# Patient Record
Sex: Female | Born: 1965 | Race: Black or African American | Hispanic: No | Smoking: Never smoker
Health system: Southern US, Community
[De-identification: ages and names within clinical notes are randomized; demographics above are authoritative.]

---

## 2021-05-31 ENCOUNTER — Emergency Department: Payer: BLUE CROSS/BLUE SHIELD

## 2021-05-31 ENCOUNTER — Other Ambulatory Visit: Payer: Self-pay

## 2021-05-31 ENCOUNTER — Emergency Department
Admission: EM | Admit: 2021-05-31 | Discharge: 2021-05-31 | Disposition: A | Discharge status: Home / Self Care | Payer: BLUE CROSS/BLUE SHIELD | Attending: Emergency Medicine | Admitting: Emergency Medicine

## 2021-05-31 ENCOUNTER — Encounter: Payer: Self-pay | Admitting: *Deleted

## 2021-05-31 DIAGNOSIS — R519 Headache, unspecified: Secondary | ICD-10-CM | POA: Diagnosis present

## 2021-05-31 DIAGNOSIS — I10 Essential (primary) hypertension: Secondary | ICD-10-CM | POA: Insufficient documentation

## 2021-05-31 DIAGNOSIS — F439 Reaction to severe stress, unspecified: Secondary | ICD-10-CM | POA: Insufficient documentation

## 2021-05-31 DIAGNOSIS — E039 Hypothyroidism, unspecified: Secondary | ICD-10-CM | POA: Insufficient documentation

## 2021-05-31 LAB — BASIC METABOLIC PANEL
Anion gap: 8 (ref 5–15)
BUN: 13 mg/dL (ref 6–20)
CO2: 25 mmol/L (ref 22–32)
Calcium: 9.2 mg/dL (ref 8.9–10.3)
Chloride: 103 mmol/L (ref 98–111)
Creatinine, Ser: 0.93 mg/dL (ref 0.44–1.00)
GFR, Estimated: >60 mL/min (ref >60)
Glucose, Bld: 102 mg/dL — ABNORMAL HIGH (ref 70–99)
Potassium: 3.8 mmol/L (ref 3.5–5.1)
Sodium: 136 mmol/L (ref 135–145)

## 2021-05-31 LAB — CBC
HCT: 37.4 % (ref 36.0–46.0)
Hemoglobin: 12 g/dL (ref 12.0–15.0)
MCH: 28.2 pg (ref 26.0–34.0)
MCHC: 32.1 g/dL (ref 30.0–36.0)
MCV: 88 fL (ref 80.0–100.0)
Platelets: 244 10*3/uL (ref 150–400)
RBC: 4.25 MIL/uL (ref 3.87–5.11)
RDW: 14.9 % (ref 11.5–15.5)
WBC: 6.7 10*3/uL (ref 4.0–10.5)
nRBC: 0 % (ref 0.0–0.2)

## 2021-05-31 LAB — TSH: TSH: 5.653 u[IU]/mL — ABNORMAL HIGH (ref 0.350–4.500)

## 2021-05-31 LAB — TROPONIN I (HIGH SENSITIVITY): Troponin I (High Sensitivity): <2 ng/L (ref <18)

## 2021-05-31 MED ORDER — LOSARTAN POTASSIUM 50 MG PO TABS: 50.0000 mg | ORAL_TABLET | Freq: Two times a day (BID) | ORAL | 2 refills | Status: AC

## 2021-05-31 MED ORDER — HYDROXYZINE PAMOATE 25 MG PO CAPS: 25.0000 mg | ORAL_CAPSULE | Freq: Every evening | ORAL | 1 refills | Status: AC | PRN

## 2021-05-31 MED ORDER — LEVOTHYROXINE SODIUM 25 MCG PO TABS: 25.0000 ug | ORAL_TABLET | Freq: Every day | ORAL | 2 refills | Status: AC

## 2021-05-31 MED ORDER — CYCLOBENZAPRINE HCL 10 MG PO TABS
10.0000 mg | ORAL_TABLET | Freq: Once | ORAL | Status: AC
  Administered 2021-05-31: 10 mg via ORAL
  Filled 2021-05-31: qty 1

## 2021-05-31 MED ORDER — METOCLOPRAMIDE HCL 10 MG PO TABS
10.0000 mg | ORAL_TABLET | Freq: Once | ORAL | Status: AC
  Administered 2021-05-31: 10 mg via ORAL
  Filled 2021-05-31: qty 1

## 2021-05-31 MED ORDER — HYDROCHLOROTHIAZIDE 25 MG PO TABS: 25.0000 mg | ORAL_TABLET | Freq: Every day | ORAL | 2 refills | Status: AC

## 2021-05-31 MED ORDER — DIPHENHYDRAMINE HCL 25 MG PO CAPS
25.0000 mg | ORAL_CAPSULE | Freq: Once | ORAL | Status: AC
  Administered 2021-05-31: 25 mg via ORAL
  Filled 2021-05-31: qty 1

## 2021-05-31 MED ORDER — CYCLOBENZAPRINE HCL 5 MG PO TABS: 5.0000 mg | ORAL_TABLET | Freq: Every day | ORAL | 0 refills | Status: AC

## 2021-05-31 MED ORDER — METOCLOPRAMIDE HCL 10 MG PO TABS: 10.0000 mg | ORAL_TABLET | Freq: Three times a day (TID) | ORAL | 0 refills | Status: AC | PRN

## 2021-05-31 NOTE — ED Triage Notes (Signed)
Pt states blood pressure elevated for 1 week.  Pt taking bp meds as prescribed.  Pt also has a headache.  Pt alert  speech clear.  Pt ambulates without diff.  No chest pain or sob.

## 2021-05-31 NOTE — Discharge Instructions (Addendum)
Take the prescription meds as directed. Follow-up with your planned primary provider for further management.

## 2021-05-31 NOTE — ED Provider Notes (Signed)
[facility] Emergency Department Provider Note     Event Date/Time   First MD Initiated Contact with Patient 05/31/21 2010     (approximate)   History   Hypertension   HPI  Deborah Villegas is a 56 y.o. female with history of hypertension, presents to the ED for 1 week of elevated blood pressure readings.  Patient admits to taking her medication as prescribed.  She denies any recent injury, illness, trauma.  She does endorse an associated headache.  She denies any slurred speech, weakness, vision change, nausea, vomiting, or syncope.   Physical Exam   Triage Vital Signs: ED Triage Vitals  Enc Vitals Group     BP 05/31/21 1949 (!) 196/107     Pulse Rate 05/31/21 1949 70     Resp 05/31/21 1949 18     Temp 05/31/21 1949 98.1 F (36.7 C)     Temp Source 05/31/21 1949 Oral     SpO2 05/31/21 1949 96 %     Weight 05/31/21 1950 210 lb (95.3 kg)     Height 05/31/21 1950 5\' 4"  (1.626 m)     Head Circumference --      Peak Flow --      Pain Score 05/31/21 1950 6     Pain Loc --      Pain Edu? --      Excl. in GC? --     Most recent vital signs: Vitals:   05/31/21 1949  BP: (!) 196/107  Pulse: 70  Resp: 18  Temp: 98.1 F (36.7 C)  SpO2: 96%    General Awake, no distress. *** {**HEENT NCAT. PERRL. EOMI. No rhinorrhea. Mucous membranes are moist. **} CV:  Good peripheral perfusion. *** RESP:  Normal effort. *** ABD:  No distention. *** {**Other: **}   ED Results / Procedures / Treatments   Labs (all labs ordered are listed, but only abnormal results are displayed) Labs Reviewed  BASIC METABOLIC PANEL - Abnormal; Notable for the following components:      Result Value   Glucose, Bld 102 (*)    All other components within normal limits  CBC  TROPONIN I (HIGH SENSITIVITY)     EKG  ***  RADIOLOGY  {**I personally viewed and evaluated these images as part of my medical decision making, as well as reviewing the written report by the  radiologist.  ED Provider Interpretation: ***}  No results found.   PROCEDURES:  Critical Care performed: {CriticalCareYesNo:19197::"Yes, see critical care procedure note(s)","No"}  Procedures   MEDICATIONS ORDERED IN ED: Medications - No data to display   IMPRESSION / MDM / ASSESSMENT AND PLAN / ED COURSE  I reviewed the triage vital signs and the nursing notes.                              Differential diagnosis includes, but is not limited to, ***  {**The patient is on the cardiac monitor to evaluate for evidence of arrhythmia and/or significant heart rate changes.**}  Patient's diagnosis is consistent with ***. Patient will be discharged home with prescriptions for ***. Patient is to follow up with *** as needed or otherwise directed. Patient is given ED precautions to return to the ED for any worsening or new symptoms.       FINAL CLINICAL IMPRESSION(S) / ED DIAGNOSES   Final diagnoses:  None     Rx / DC Orders   ED  Discharge Orders     None        Note:  This document was prepared using Dragon voice recognition software and may include unintentional dictation errors.

## 2022-10-29 IMAGING — CT CT HEAD W/O CM
4 series · 17 of 47 positions shown, 19 images · non-contrast
Comparison: None Available.

CLINICAL DATA: Headache with hypertension.



[Series 2: head wo · axial · 0.45mm/px · z∈[-83,+47]mm · 7 of 36 slices shown, 9 images]
[im 5/36  brain]
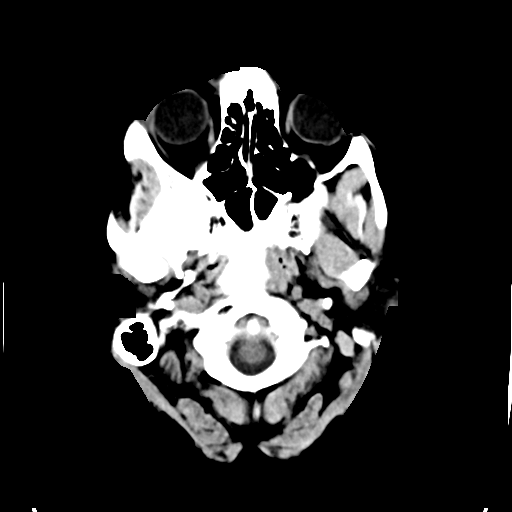
[im 5/36  bone]
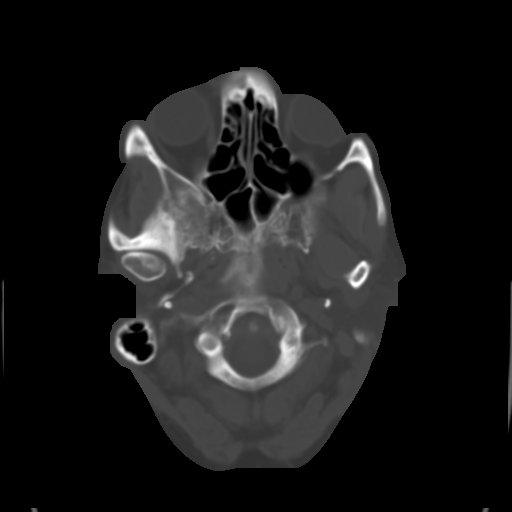
[im 9/36  brain]
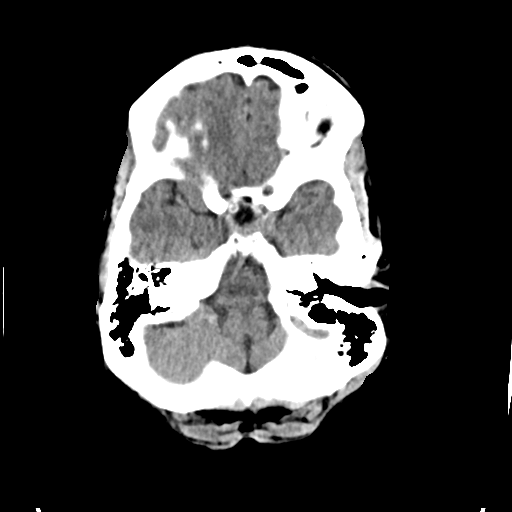
[im 14/36  brain]
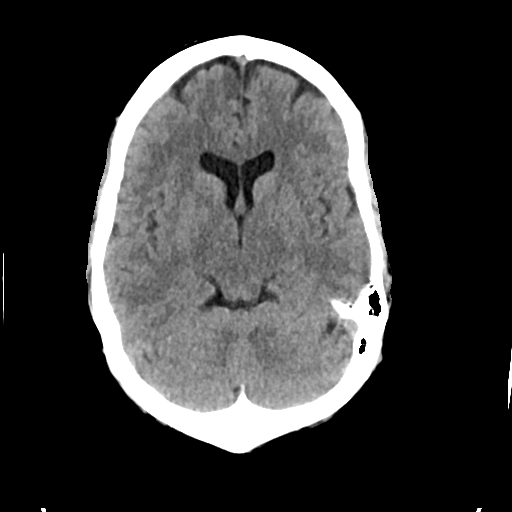
[im 18/36  brain]
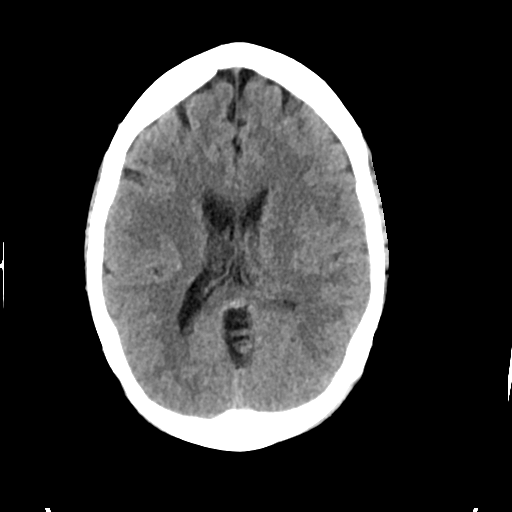
[im 22/36  brain]
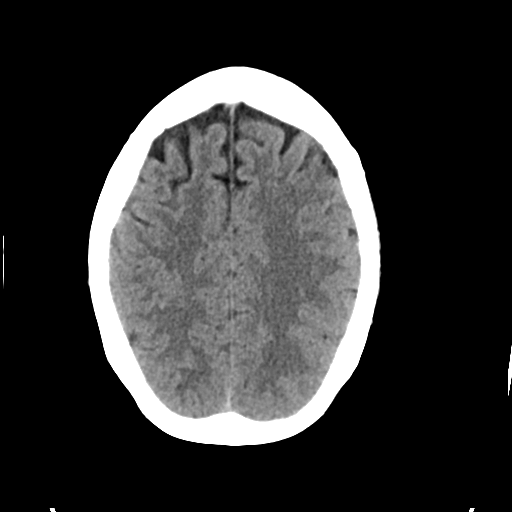
[im 22/36  bone]
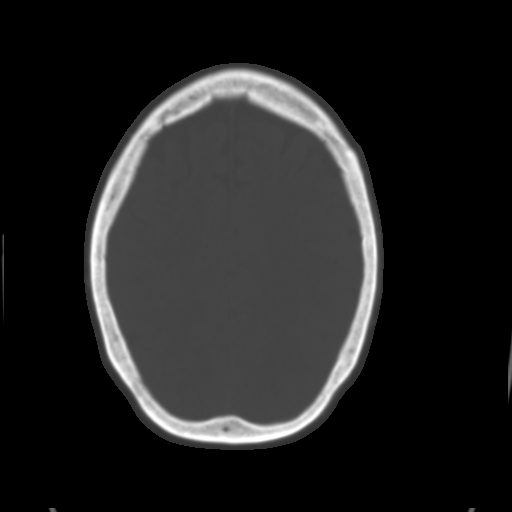
[im 27/36  brain]
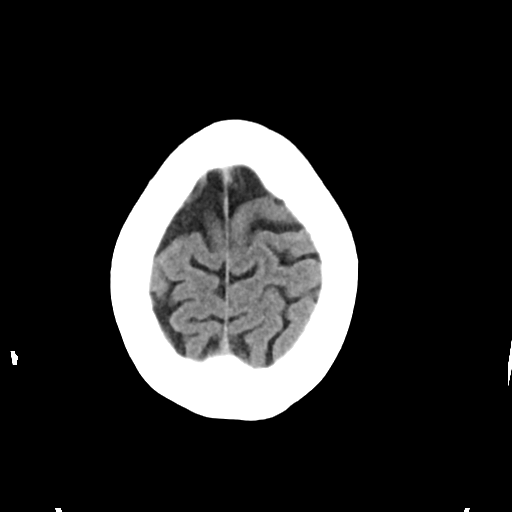
[im 31/36  brain]
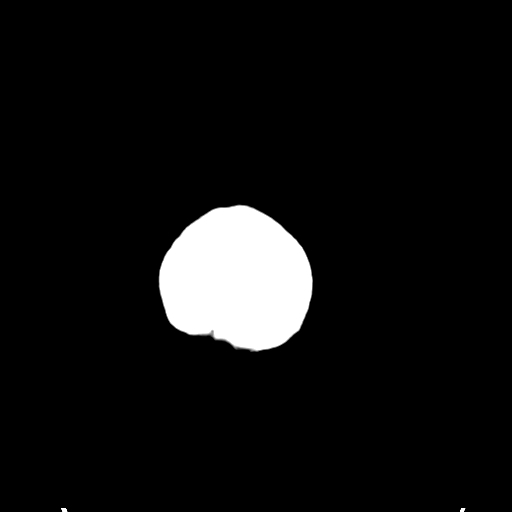

[Series 3: head bone · axial · 0.45mm/px · z∈[-87,-23]mm · 4 of 90 slices shown]
[im 9/90  bone]
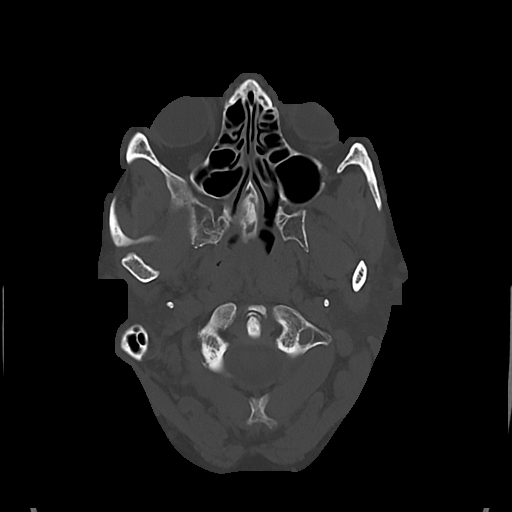
[im 18/90  bone]
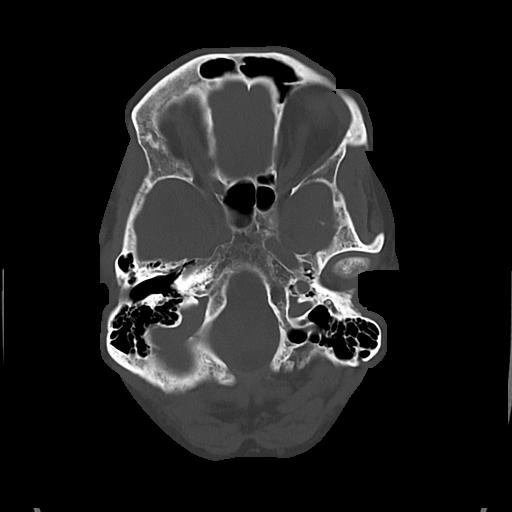
[im 27/90  bone]
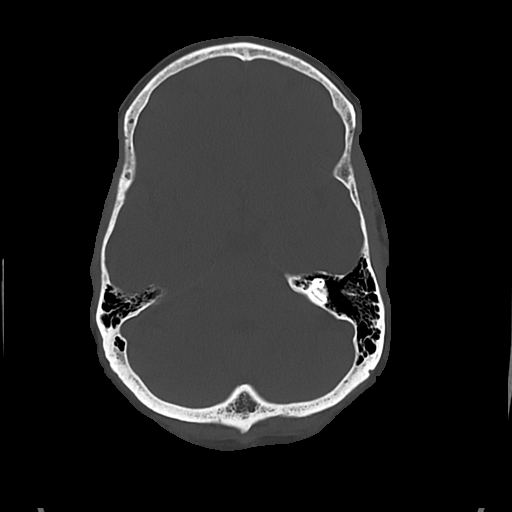
[im 41/90  bone]
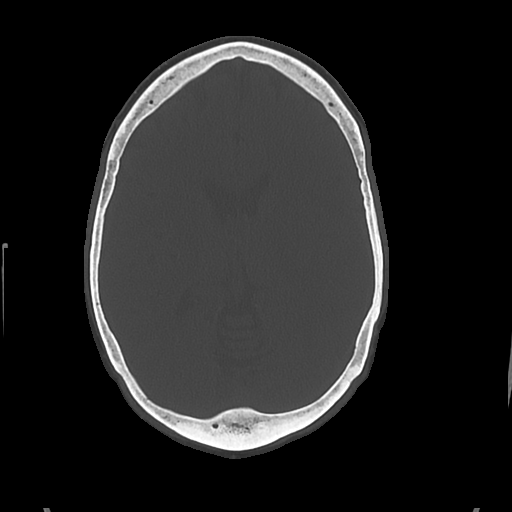

[Series 4: cor soft · coronal · 0.34mm/px · 3 of 68 slices shown]
[im 23/68  brain]
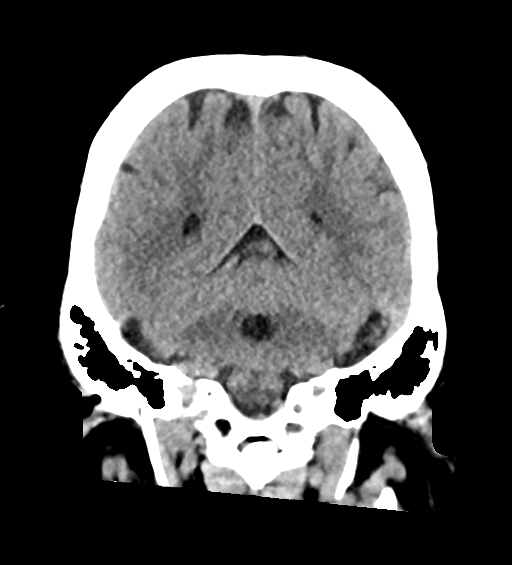
[im 30/68  brain]
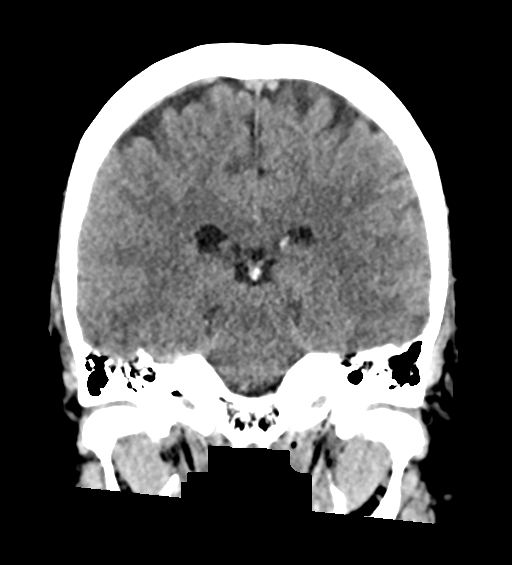
[im 38/68  brain]
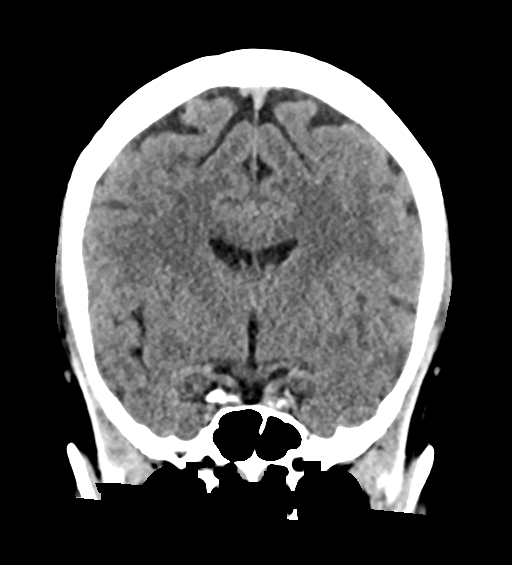

[Series 5: sag soft · sagittal · 0.38mm/px · 3 of 58 slices shown]
[im 20/58  brain]
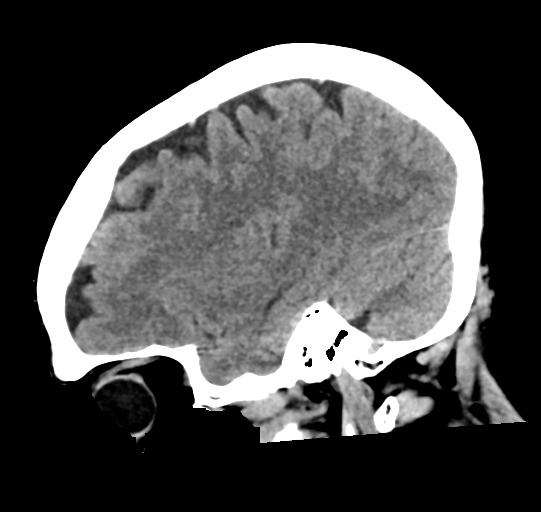
[im 29/58  brain]
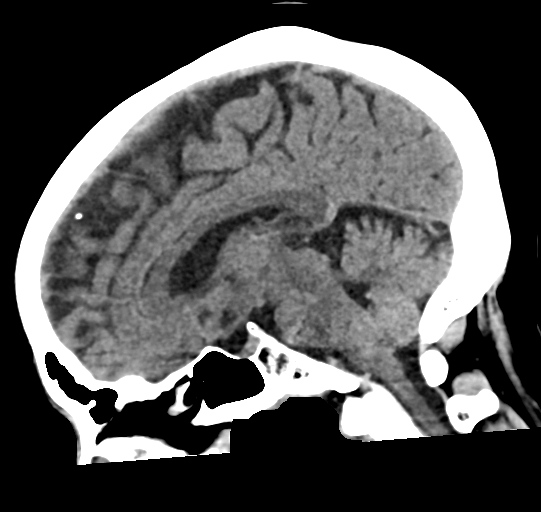
[im 39/58  brain]
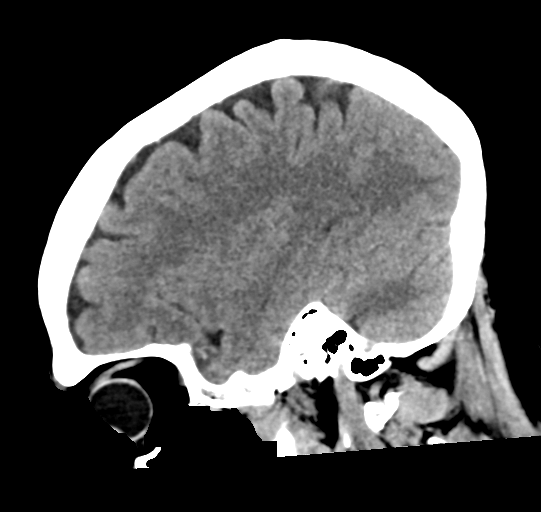

[17 of 47 positions shown; findings below may reference images not displayed]

FINDINGS: Brain: No evidence of acute infarction, hemorrhage, hydrocephalus,
extra-axial collection or mass lesion/mass effect.

Vascular: There are trace calcifications of the carotid siphons but
no hyperdense central vessels.

Skull: Normal. Negative for fracture or focal lesion.

Sinuses/Orbits: No acute finding.

Other: There is no mastoid effusion or middle ear opacity.
IMPRESSION: No acute intracranial CT findings.
